# Patient Record
Sex: Female | Born: 1990 | Race: Black or African American | Hispanic: No | Marital: Married | Smoking: Never smoker
Health system: Southern US, Community
[De-identification: ages and names within clinical notes are randomized; demographics above are authoritative.]

---

## 2015-03-16 NOTE — L&D Delivery Note (Signed)
Pt complete and at +2 station with urge to push. Epidural controlling pain. Pt pushed 3 times in to deliver a viable female infant in ROA position over intact perineum. Pt was flattened and placed in McRoberts position over next two pushes and then delivered infants shoulders after baby rotated to LOA position. Body easily followed next.  Infant placed on mothers abdomen and bulb suction of mouth and nose performed. Cord was then clamped and cut by FOB after a minute delay. Cord blood obtained. Baby had a vigorous spontaneous cry noted. Placenta then delivered about 5-7 mins later intact, 3VC shultz. Fundal massage performed and pitocin per protocol. Fundus firm. Inspection confirmed periurethral hemostatic abrasions only. No repair needed.  Mother and baby stable. Counts correct. Apgars of 8 and 9; weight pending

## 2015-03-21 LAB — OB RESULTS CONSOLE GC/CHLAMYDIA
CHLAMYDIA, DNA PROBE: NEGATIVE
CHLAMYDIA, DNA PROBE: NEGATIVE
GC PROBE AMP, GENITAL: NEGATIVE
GC PROBE AMP, GENITAL: NEGATIVE

## 2015-03-21 LAB — OB RESULTS CONSOLE ABO/RH: RH TYPE: POSITIVE

## 2015-03-21 LAB — OB RESULTS CONSOLE HEPATITIS B SURFACE ANTIGEN: HEP B S AG: NEGATIVE

## 2015-03-21 LAB — OB RESULTS CONSOLE RUBELLA ANTIBODY, IGM: Rubella: IMMUNE

## 2015-03-21 LAB — OB RESULTS CONSOLE HIV ANTIBODY (ROUTINE TESTING): HIV: NONREACTIVE

## 2015-03-21 LAB — OB RESULTS CONSOLE RPR: RPR: NONREACTIVE

## 2015-05-19 ENCOUNTER — Other Ambulatory Visit (HOSPITAL_COMMUNITY): Payer: Self-pay | Admitting: Obstetrics and Gynecology

## 2015-05-21 ENCOUNTER — Other Ambulatory Visit (HOSPITAL_COMMUNITY): Payer: Self-pay | Admitting: Obstetrics and Gynecology

## 2015-05-21 ENCOUNTER — Ambulatory Visit (HOSPITAL_COMMUNITY)
Admission: RE | Admit: 2015-05-21 | Discharge: 2015-05-21 | Disposition: A | Discharge status: Home / Self Care | Payer: 59 | Source: Ambulatory Visit | Attending: Obstetrics and Gynecology | Admitting: Obstetrics and Gynecology

## 2015-05-21 DIAGNOSIS — Z3689 Encounter for other specified antenatal screening: Secondary | ICD-10-CM

## 2015-05-21 DIAGNOSIS — O283 Abnormal ultrasonic finding on antenatal screening of mother: Secondary | ICD-10-CM | POA: Insufficient documentation

## 2015-05-21 DIAGNOSIS — Z3A20 20 weeks gestation of pregnancy: Secondary | ICD-10-CM | POA: Insufficient documentation

## 2015-05-21 DIAGNOSIS — O99212 Obesity complicating pregnancy, second trimester: Secondary | ICD-10-CM | POA: Insufficient documentation

## 2015-07-11 LAB — OB RESULTS CONSOLE HIV ANTIBODY (ROUTINE TESTING): HIV: NONREACTIVE

## 2015-07-29 ENCOUNTER — Encounter (HOSPITAL_COMMUNITY): Payer: Self-pay

## 2015-07-30 ENCOUNTER — Other Ambulatory Visit (HOSPITAL_COMMUNITY): Payer: Self-pay | Admitting: Obstetrics and Gynecology

## 2015-07-30 ENCOUNTER — Encounter (HOSPITAL_COMMUNITY): Payer: Self-pay

## 2015-07-30 ENCOUNTER — Ambulatory Visit (HOSPITAL_COMMUNITY)
Admission: RE | Admit: 2015-07-30 | Discharge: 2015-07-30 | Disposition: A | Discharge status: Home / Self Care | Payer: 59 | Source: Ambulatory Visit | Attending: Obstetrics and Gynecology | Admitting: Obstetrics and Gynecology

## 2015-07-30 DIAGNOSIS — Z3A30 30 weeks gestation of pregnancy: Secondary | ICD-10-CM | POA: Diagnosis not present

## 2015-07-30 DIAGNOSIS — O283 Abnormal ultrasonic finding on antenatal screening of mother: Secondary | ICD-10-CM | POA: Insufficient documentation

## 2015-07-30 DIAGNOSIS — O99213 Obesity complicating pregnancy, third trimester: Secondary | ICD-10-CM | POA: Diagnosis not present

## 2015-07-30 DIAGNOSIS — O358XX0 Maternal care for other (suspected) fetal abnormality and damage, not applicable or unspecified: Secondary | ICD-10-CM

## 2015-07-30 DIAGNOSIS — O35EXX0 Maternal care for other (suspected) fetal abnormality and damage, fetal genitourinary anomalies, not applicable or unspecified: Secondary | ICD-10-CM

## 2015-09-01 LAB — OB RESULTS CONSOLE GBS: STREP GROUP B AG: NEGATIVE

## 2015-09-30 ENCOUNTER — Inpatient Hospital Stay (HOSPITAL_COMMUNITY): Payer: 59 | Admitting: Anesthesiology

## 2015-09-30 ENCOUNTER — Inpatient Hospital Stay (HOSPITAL_COMMUNITY)
Admission: AD | Admit: 2015-09-30 | Discharge: 2015-10-02 | DRG: 775 | Disposition: A | Discharge status: Home / Self Care | Payer: 59 | Source: Ambulatory Visit | Attending: Obstetrics and Gynecology | Admitting: Obstetrics and Gynecology

## 2015-09-30 ENCOUNTER — Encounter (HOSPITAL_COMMUNITY): Payer: Self-pay

## 2015-09-30 DIAGNOSIS — Z3A39 39 weeks gestation of pregnancy: Secondary | ICD-10-CM | POA: Diagnosis not present

## 2015-09-30 DIAGNOSIS — Z3483 Encounter for supervision of other normal pregnancy, third trimester: Secondary | ICD-10-CM | POA: Diagnosis present

## 2015-09-30 DIAGNOSIS — Z3490 Encounter for supervision of normal pregnancy, unspecified, unspecified trimester: Secondary | ICD-10-CM

## 2015-09-30 LAB — CBC
HCT: 32.6 % — ABNORMAL LOW (ref 36.0–46.0)
HEMOGLOBIN: 10.9 g/dL — AB (ref 12.0–15.0)
MCH: 29.4 pg (ref 26.0–34.0)
MCHC: 33.4 g/dL (ref 30.0–36.0)
MCV: 87.9 fL (ref 78.0–100.0)
Platelets: 282 10*3/uL (ref 150–400)
RBC: 3.71 MIL/uL — AB (ref 3.87–5.11)
RDW: 13.2 % (ref 11.5–15.5)
WBC: 6.6 10*3/uL (ref 4.0–10.5)

## 2015-09-30 LAB — ABO/RH: ABO/RH(D): O POS

## 2015-09-30 LAB — TYPE AND SCREEN
ABO/RH(D): O POS
Antibody Screen: NEGATIVE

## 2015-09-30 MED ORDER — ACETAMINOPHEN 325 MG PO TABS: 650.0000 mg | ORAL_TABLET | ORAL | Status: DC | PRN

## 2015-09-30 MED ORDER — LACTATED RINGERS IV SOLN: 500.0000 mL | INTRAVENOUS | Status: DC | PRN

## 2015-09-30 MED ORDER — LACTATED RINGERS IV SOLN
INTRAVENOUS | Status: DC
  Administered 2015-09-30: 17:00:00 via INTRAVENOUS

## 2015-09-30 MED ORDER — OXYTOCIN BOLUS FROM INFUSION
500.0000 mL | INTRAVENOUS | Status: DC
  Administered 2015-10-01: 500 mL via INTRAVENOUS

## 2015-09-30 MED ORDER — FENTANYL 2.5 MCG/ML BUPIVACAINE 1/10 % EPIDURAL INFUSION (WH - ANES)
14.0000 mL/h | INTRAMUSCULAR | Status: DC | PRN
  Administered 2015-09-30: 14 mL/h via EPIDURAL
  Filled 2015-09-30: qty 125

## 2015-09-30 MED ORDER — OXYCODONE-ACETAMINOPHEN 5-325 MG PO TABS: 1.0000 | ORAL_TABLET | ORAL | Status: DC | PRN

## 2015-09-30 MED ORDER — PHENYLEPHRINE 40 MCG/ML (10ML) SYRINGE FOR IV PUSH (FOR BLOOD PRESSURE SUPPORT)
80.0000 ug | PREFILLED_SYRINGE | INTRAVENOUS | Status: DC | PRN
  Filled 2015-09-30: qty 10
  Filled 2015-09-30: qty 5

## 2015-09-30 MED ORDER — SOD CITRATE-CITRIC ACID 500-334 MG/5ML PO SOLN: 30.0000 mL | ORAL | Status: DC | PRN

## 2015-09-30 MED ORDER — OXYTOCIN 40 UNITS IN LACTATED RINGERS INFUSION - SIMPLE MED
2.5000 [IU]/h | INTRAVENOUS | Status: DC
  Filled 2015-09-30: qty 1000

## 2015-09-30 MED ORDER — OXYCODONE-ACETAMINOPHEN 5-325 MG PO TABS: 2.0000 | ORAL_TABLET | ORAL | Status: DC | PRN

## 2015-09-30 MED ORDER — LIDOCAINE HCL (PF) 1 % IJ SOLN
30.0000 mL | INTRAMUSCULAR | Status: DC | PRN
  Filled 2015-09-30: qty 30

## 2015-09-30 MED ORDER — OXYTOCIN 40 UNITS IN LACTATED RINGERS INFUSION - SIMPLE MED
1.0000 m[IU]/min | INTRAVENOUS | Status: DC
  Administered 2015-09-30: 2 m[IU]/min via INTRAVENOUS

## 2015-09-30 MED ORDER — EPHEDRINE 5 MG/ML INJ
10.0000 mg | INTRAVENOUS | Status: DC | PRN
  Filled 2015-09-30: qty 2

## 2015-09-30 MED ORDER — DIPHENHYDRAMINE HCL 50 MG/ML IJ SOLN: 12.5000 mg | INTRAMUSCULAR | Status: DC | PRN

## 2015-09-30 MED ORDER — TERBUTALINE SULFATE 1 MG/ML IJ SOLN
0.2500 mg | Freq: Once | INTRAMUSCULAR | Status: DC | PRN
  Filled 2015-09-30: qty 1

## 2015-09-30 MED ORDER — ONDANSETRON HCL 4 MG/2ML IJ SOLN: 4.0000 mg | Freq: Four times a day (QID) | INTRAMUSCULAR | Status: DC | PRN

## 2015-09-30 MED ORDER — LACTATED RINGERS IV SOLN: 500.0000 mL | Freq: Once | INTRAVENOUS | Status: DC

## 2015-09-30 MED ORDER — LIDOCAINE HCL (PF) 1 % IJ SOLN
INTRAMUSCULAR | Status: DC | PRN
  Administered 2015-09-30 (×2): 5 mL

## 2015-09-30 NOTE — Anesthesia Preprocedure Evaluation (Signed)

## 2015-09-30 NOTE — H&P (Signed)
Mackenzie Rosario is a 25 y.o. female presenting for elective iol at term. Pt has had irregular contractions in past few days; no LOF or VB. +Fms Pt's prenatal course is dated per first trimester [place]. She screened negative for [place] as conceived [place]. Pregnancy was complicated by noted b/l pyelectasis noted on fetal scan - followed by mfm with resolution of left side and persistence of right but no progression - plan to have baby assessed after delivery. She is GBS negative  Maternal Medical History:  Reason for admission: Contractions.  Nausea.  Contractions: Onset was yesterday.   Frequency: irregular.   Perceived severity is mild.    Fetal activity: Perceived fetal activity is normal.   Last perceived fetal movement was within the past hour.    Prenatal complications: no prenatal complications Prenatal Complications - Diabetes: none.    OB History    Gravida Para Term Preterm AB TAB SAB Ectopic Multiple Living   5         2     History reviewed. No pertinent past medical history. History reviewed. No pertinent past surgical history. Family History: family history is not on file. Social History:  reports that she has never smoked. She has never used smokeless tobacco. She reports that she does not drink alcohol or use illicit drugs.   Prenatal Transfer Tool  Maternal Diabetes: No Genetic Screening: Normal Maternal Ultrasounds/Referrals: Abnormal:  Findings:   Fetal renal pyelectasis Fetal Ultrasounds or other Referrals:  Referred to Materal Fetal Medicine  Maternal Substance Abuse:  No Significant Maternal Medications:  None Significant Maternal Lab Results:  Lab values include: Group B Strep negative Other Comments:  None  Review of Systems  Constitutional: Negative for fever, chills, weight loss and malaise/fatigue.  Eyes: Negative for blurred vision and double vision.  Respiratory: Negative for shortness of breath.   Cardiovascular: Negative for chest  pain.  Gastrointestinal: Positive for abdominal pain. Negative for heartburn, nausea and vomiting.  Genitourinary: Negative for dysuria.  Musculoskeletal: Positive for myalgias and back pain.  Skin: Negative for itching and rash.  Neurological: Negative for dizziness and headaches.  Psychiatric/Behavioral: Negative for depression, suicidal ideas, hallucinations and substance abuse. The patient has insomnia. The patient is not nervous/anxious.       Blood pressure 118/72, pulse 91, temperature 98.4 F (36.9 C), temperature source Oral, resp. rate 16, last menstrual period 12/31/2014. Maternal Exam:  Uterine Assessment: Contraction strength is mild.  Contraction frequency is rare.   Abdomen: Patient reports generalized tenderness.  Estimated fetal weight is AGA.   Fetal presentation: vertex  Introitus: Normal vulva. Normal vagina.  Pelvis: adequate for delivery.   Cervix: Cervix evaluated by digital exam.     Physical Exam  Constitutional: She is oriented to person, place, and time. She appears well-developed and well-nourished.  HENT:  Head: Normocephalic.  Eyes: Pupils are equal, round, and reactive to light.  Neck: Normal range of motion.  Cardiovascular: Normal rate.   Respiratory: Effort normal.  GI: Soft. There is generalized tenderness.  Genitourinary: Vagina normal and uterus normal.  Musculoskeletal: Normal range of motion. She exhibits tenderness. She exhibits no edema.  Neurological: She is alert and oriented to person, place, and time.  Skin: Skin is warm.  Psychiatric: She has a normal mood and affect. Her behavior is normal. Judgment and thought content normal.    Prenatal labs: ABO, Rh:   Antibody:   Rubella: Immune (01/06 0000) RPR:    HBsAg: Negative (01/06 0000)  HIV: Non-reactive (04/28 0000)  GBS: Negative (06/19 0000)   Assessment/Plan: [id number] at 3639 6/[redacted]wks gestation for elective iol Will start pitocin  Will AROM when able Epidural prn Anticipate  svd   [facility] LL[facility]ecilia Worema Rand Rosario 09/30/2015, 5:07 PM

## 2015-09-30 NOTE — Anesthesia Procedure Notes (Signed)
Epidural Patient location during procedure: OB  Staffing Anesthesiologist: Elvin Mccartin Performed by: anesthesiologist   Preanesthetic Checklist Completed: patient identified, site marked, surgical consent, pre-op evaluation, timeout performed, IV checked, risks and benefits discussed and monitors and equipment checked  Epidural Patient position: sitting Prep: DuraPrep Patient monitoring: heart rate, continuous pulse ox and blood pressure Approach: right paramedian Location: L3-L4 Injection technique: LOR saline  Needle:  Needle type: Tuohy  Needle gauge: 17 G Needle length: 9 cm and 9 Needle insertion depth: 6 cm Catheter type: closed end flexible Catheter size: 20 Guage Catheter at skin depth: 11 cm Test dose: negative  Assessment Events: blood not aspirated, injection not painful, no injection resistance, negative IV test and no paresthesia  Additional Notes Patient identified. Risks/Benefits/Options discussed with patient including but not limited to bleeding, infection, nerve damage, paralysis, failed block, incomplete pain control, headache, blood pressure changes, nausea, vomiting, reactions to medication both or allergic, itching and postpartum back pain. Confirmed with bedside nurse the patient's most recent platelet count. Confirmed with patient that they are not currently taking any anticoagulation, have any bleeding history or any family history of bleeding disorders. Patient expressed understanding and wished to proceed. All questions were answered. Sterile technique was used throughout the entire procedure. Please see nursing notes for vital signs. Test dose was given through epidural needle and negative prior to continuing to dose epidural or start infusion. Warning signs of high block given to the patient including shortness of breath, tingling/numbness in hands, complete motor block, or any concerning symptoms with instructions to call for help. Patient was given  instructions on fall risk and not to get out of bed. All questions and concerns addressed with instructions to call with any issues.   

## 2015-09-30 NOTE — Progress Notes (Signed)
Patient ID: Mackenzie Rosario, female   DOB: 1991/01/12, 25 y.o.   MRN: [id number] Pt comfortable with epidural. No complaints VSS EFM - cat 1 TOCO - q 3-434mins SVE 4-5cm dil  A/P: Progressing well on pitocin         Anticipate svd

## 2015-09-30 NOTE — Progress Notes (Signed)
Patient ID: Mackenzie ScarletDeandraya Rosario, female   DOB: 20-Oct-1990, 25 y.o.   MRN: [id number] Pt comfortable - appreciating contractions but tolerable. No complaints. +Fms VSS EFM 140, moderate variability, +accels, -decels, cat 1 TOCO contractions q 1-3 SVE 4/80/-2  A/P: Progressing in labor on pitocin          AROM performed with moderate return of clear amniotic fluid         Anticipate svd         Pain control prn

## 2015-09-30 NOTE — Anesthesia Pain Management Evaluation Note (Signed)
  CRNA Pain Management Visit Note  Patient: Mackenzie Rosario, 25 y.o., female  "Hello I am a [facility]. We have an anesthesia team available at all times to provide care throughout the hospital, including epidural management and anesthesia for C-section. I don't know your plan for the delivery whether it a natural birth, water birth, IV sedation, nitrous supplementation, doula or epidural, but we want to meet your pain goals."   1.Was your pain managed to your expectations on prior hospitalizations?   Yes   2.What is your expectation for pain management during this hospitalization?     Epidural  3.How can we help you reach that goal? epidural  Record the patient's initial score and the patient's pain goal.   Pain: 1  Pain Goal: 5 [facility] wants you to be able to say your pain was always managed very well.  Mackenzie HickmanGREGORY,Amita Atayde 09/30/2015

## 2015-10-01 ENCOUNTER — Encounter (HOSPITAL_COMMUNITY): Payer: Self-pay

## 2015-10-01 LAB — CBC
HEMATOCRIT: 31.8 % — AB (ref 36.0–46.0)
HEMOGLOBIN: 10.7 g/dL — AB (ref 12.0–15.0)
MCH: 29.6 pg (ref 26.0–34.0)
MCHC: 33.6 g/dL (ref 30.0–36.0)
MCV: 88.1 fL (ref 78.0–100.0)
Platelets: 246 10*3/uL (ref 150–400)
RBC: 3.61 MIL/uL — AB (ref 3.87–5.11)
RDW: 13.1 % (ref 11.5–15.5)
WBC: 10.5 10*3/uL (ref 4.0–10.5)

## 2015-10-01 MED ORDER — PRENATAL MULTIVITAMIN CH
1.0000 | ORAL_TABLET | Freq: Every day | ORAL | Status: DC
  Administered 2015-10-01: 1 via ORAL
  Filled 2015-10-01 (×2): qty 1

## 2015-10-01 MED ORDER — DIBUCAINE 1 % RE OINT
1.0000 | TOPICAL_OINTMENT | RECTAL | Status: DC | PRN
Start: 2015-10-01 — End: 2015-10-02

## 2015-10-01 MED ORDER — SIMETHICONE 80 MG PO CHEW: 80.0000 mg | CHEWABLE_TABLET | ORAL | Status: DC | PRN

## 2015-10-01 MED ORDER — IBUPROFEN 600 MG PO TABS
600.0000 mg | ORAL_TABLET | Freq: Four times a day (QID) | ORAL | Status: DC
  Administered 2015-10-01 – 2015-10-02 (×5): 600 mg via ORAL
  Filled 2015-10-01 (×6): qty 1

## 2015-10-01 MED ORDER — ONDANSETRON HCL 4 MG/2ML IJ SOLN: 4.0000 mg | INTRAMUSCULAR | Status: DC | PRN

## 2015-10-01 MED ORDER — DIPHENHYDRAMINE HCL 25 MG PO CAPS: 25.0000 mg | ORAL_CAPSULE | Freq: Four times a day (QID) | ORAL | Status: DC | PRN

## 2015-10-01 MED ORDER — COCONUT OIL OIL: 1.0000 "application " | TOPICAL_OIL | Status: DC | PRN

## 2015-10-01 MED ORDER — BENZOCAINE-MENTHOL 20-0.5 % EX AERO: 1.0000 "application " | INHALATION_SPRAY | CUTANEOUS | Status: DC | PRN

## 2015-10-01 MED ORDER — WITCH HAZEL-GLYCERIN EX PADS: 1.0000 "application " | MEDICATED_PAD | CUTANEOUS | Status: DC | PRN

## 2015-10-01 MED ORDER — ONDANSETRON HCL 4 MG PO TABS: 4.0000 mg | ORAL_TABLET | ORAL | Status: DC | PRN

## 2015-10-01 MED ORDER — ZOLPIDEM TARTRATE 5 MG PO TABS: 5.0000 mg | ORAL_TABLET | Freq: Every evening | ORAL | Status: DC | PRN

## 2015-10-01 MED ORDER — TETANUS-DIPHTH-ACELL PERTUSSIS 5-2.5-18.5 LF-MCG/0.5 IM SUSP: 0.5000 mL | Freq: Once | INTRAMUSCULAR | Status: DC

## 2015-10-01 MED ORDER — SENNOSIDES-DOCUSATE SODIUM 8.6-50 MG PO TABS
2.0000 | ORAL_TABLET | ORAL | Status: DC
  Administered 2015-10-02: 2 via ORAL
  Filled 2015-10-01: qty 2

## 2015-10-01 MED ORDER — ACETAMINOPHEN 325 MG PO TABS: 650.0000 mg | ORAL_TABLET | ORAL | Status: DC | PRN

## 2015-10-01 NOTE — Lactation Note (Signed)
This note was copied from a baby's chart. Lactation Consultation Note  Patient Name: Mackenzie Rosario Mackenzie Rosario's Date: 10/01/2015 Reason for consult: Follow-up assessment Mom had baby latched in cradle hold when LC arrived but baby did not appear to have good depth with latch. Assisted Mom with positioning to obtain more depth with latch. Demonstrated how to un-tuck lips at breast. Encouraged Mom to BF with feeding ques, advised baby should be at breast 8-12 time or more in 24 hours, cluster feeding discussed. Encouraged STS and to wake baby if needed. Mom denies discomfort with nursing, denies other questions/concerns. Encouraged to call for assist as needed.   Maternal Data    Feeding Feeding Type: Breast Fed Length of feed: 20 min  LATCH Score/Interventions Latch: Grasps breast easily, tongue down, lips flanged, rhythmical sucking. Intervention(s): Adjust position;Assist with latch;Breast massage;Breast compression  Audible Swallowing: A few with stimulation  Type of Nipple: Everted at rest and after stimulation  Comfort (Breast/Nipple): Soft / non-tender     Hold (Positioning): Assistance needed to correctly position infant at breast and maintain latch. Intervention(s): Breastfeeding basics reviewed;Support Pillows;Position options;Skin to skin  LATCH Score: 8  Lactation Tools Discussed/Used     Consult Status Consult Status: Follow-up Date: 10/02/15 Follow-up type: In-patient    Alfred LevinsGranger, Maicol Bowland Ann 10/01/2015, 4:02 PM

## 2015-10-01 NOTE — Progress Notes (Signed)
Post Partum Day 1 Subjective: no complaints and tolerating PO  Objective: Blood pressure 110/56, pulse 76, temperature 98.9 F (37.2 C), temperature source Oral, resp. rate 18, last menstrual period 12/31/2014, SpO2 98 %, unknown if currently breastfeeding.  Physical Exam:  General: alert and cooperative Lochia: appropriate Uterine Fundus: firm    Recent Labs  09/30/15 1630 10/01/15 0602  HGB 10.9* 10.7*  HCT 32.6* 31.8*    Assessment/Plan: Plan for discharge tomorrow   LOS: 1 day   Gwenetta Devos W 10/01/2015, 10:09 AM

## 2015-10-01 NOTE — Progress Notes (Signed)
Admission nutrition screen triggered for unintentional weight loss > 10 lbs within the last month. PNR indicates a 1 lb weight loss  . Patients chart reviewed and assessed  for nutritional risk. Patient is determined to be at low nutrition  risk.   Elisabeth CaraKatherine Donyea Beverlin M.Odis LusterEd. R.D. LDN Neonatal Nutrition Support Specialist/RD III Pager [phone]      Phone [phone]

## 2015-10-01 NOTE — Anesthesia Postprocedure Evaluation (Signed)
Anesthesia Post Note  Patient: Mackenzie Rosario  Procedure(s) Performed: * No procedures listed *  Patient location during evaluation: Mother Baby Anesthesia Type: Epidural Level of consciousness: awake, awake and alert and oriented Pain management: pain level controlled Vital Signs Assessment: post-procedure vital signs reviewed and stable Respiratory status: spontaneous breathing, nonlabored ventilation and respiratory function stable Cardiovascular status: stable Postop Assessment: no headache, no backache, patient able to bend at knees, no signs of nausea or vomiting and adequate PO intake Anesthetic complications: no     Last Vitals:  Filed Vitals:   10/01/15 0310 10/01/15 0415  BP: 117/51 110/56  Pulse: 86 76  Temp: 37.3 C 37.2 C  Resp: 18 18    Last Pain:  Filed Vitals:   10/01/15 0606  PainSc: 0-No pain   Pain Goal:                 Justine Cossin

## 2015-10-01 NOTE — Lactation Note (Signed)
This note was copied from a baby's chart. Lactation Consultation Note Experienced BF mom has 396 yr old that she didn't really BF. Tried but not long. Her 27 yr old BF her for 3 months. Mom states baby BF approx. 40 min. In L&D. Mom states she has seen some colostrum. Encouraged STS, I&O. Mom encouraged to feed baby 8-12 times/24 hours and with feeding cues. Referred to Baby and Me Book in Breastfeeding section Pg. 22-23 for position options and Proper latch demonstration.WH/LC brochure given w/resources, support groups and LC services. Patient Name: Mackenzie Rosario ZOXWR'UToday's Date: 10/01/2015 Reason for consult: Initial assessment   Maternal Data Has patient been taught Hand Expression?: Yes Does the patient have breastfeeding experience prior to this delivery?: Yes  Feeding Feeding Type: Breast Fed Length of feed: 15 min  LATCH Score/Interventions Latch: Repeated attempts needed to sustain latch, nipple held in mouth throughout feeding, stimulation needed to elicit sucking reflex. Intervention(s): Adjust position;Assist with latch  Audible Swallowing: A few with stimulation Intervention(s): Skin to skin  Type of Nipple: Everted at rest and after stimulation  Comfort (Breast/Nipple): Soft / non-tender     Hold (Positioning): Assistance needed to correctly position infant at breast and maintain latch. Intervention(s): Skin to skin;Position options;Support Pillows;Breastfeeding basics reviewed  LATCH Score: 7  Lactation Tools Discussed/Used WIC Program: No   Consult Status Consult Status: Follow-up Date: 10/02/15 Follow-up type: In-patient    Charyl DancerCARVER, Arley Salamone G 10/01/2015, 3:41 AM

## 2015-10-01 NOTE — Progress Notes (Signed)
Patient ID: Mackenzie Rosario, female   DOB: May 06, 1990, 25 y.o.   MRN: [id number] Pt reports increased pelvic pressure Completely dilated and at +2station  Will commence pushing Anticipate svd

## 2015-10-02 LAB — RPR: RPR: NONREACTIVE

## 2015-10-02 MED ORDER — PRENATAL MULTIVITAMIN CH: 1.0000 | ORAL_TABLET | Freq: Every day | ORAL | Status: AC

## 2015-10-02 MED ORDER — IBUPROFEN 800 MG PO TABS: 800.0000 mg | ORAL_TABLET | Freq: Three times a day (TID) | ORAL | Status: DC | PRN

## 2015-10-02 NOTE — Lactation Note (Addendum)
This note was copied from a baby's chart. Lactation Consultation Note  Patient Name: Mackenzie Rosario's Date: 10/02/2015 Reason for consult: Follow-up assessment Charted on wrong patient.   Maternal Data    Feeding Feeding Type: Breast Fed Length of feed: 10 min  LATCH Score/Interventions                      Lactation Tools Discussed/Used     Consult Status Consult Status: Complete Date: 10/02/15 Follow-up type: In-patient    Mackenzie Rosario, Mackenzie Rosario 10/02/2015, 11:55 AM

## 2015-10-02 NOTE — Lactation Note (Signed)
This note was copied from a baby's chart. Lactation Consultation Note  Patient Name: Mackenzie Guinevere ScarletDeandraya Soledad WUJWJ'XToday's Date: 10/02/2015 Reason for consult: Follow-up assessment Mom reports baby is nursing well, denies questions/concerns. LC offered to assist with latch before d/c but Mom declined need. Encouraged to BF with feeding ques, 8-12 times in 24 hours. Engorgement care reviewed if needed, refer to Baby N Me booklet, page 24. Advised of OP services and support group. Encouraged to call for questions/concerns.   Maternal Data    Feeding Feeding Type: Breast Fed Length of feed: 10 min  LATCH Score/Interventions                      Lactation Tools Discussed/Used     Consult Status Consult Status: Complete Date: 10/02/15 Follow-up type: In-patient    Mackenzie Rosario, Mackenzie Rosario 10/02/2015, 12:13 PM

## 2015-10-02 NOTE — Progress Notes (Signed)
Post Partum Day 2 Subjective: no complaints, up ad lib, voiding, tolerating PO and nl lochia, pain controlled  Objective: Blood pressure 113/61, pulse 62, temperature 98.3 F (36.8 C), temperature source Oral, resp. rate 18, last menstrual period 12/31/2014, SpO2 98 %, unknown if currently breastfeeding.  Physical Exam:  General: alert and no distress Lochia: appropriate Uterine Fundus: firm   Recent Labs  09/30/15 1630 10/01/15 0602  HGB 10.9* 10.7*  HCT 32.6* 31.8*    Assessment/Plan: Discharge home.  Lactation c/s before d/c.  [facility]     LOS: 2 days   Bovard-Stuckert, Juwon Scripter 10/02/2015, 8:10 AM

## 2015-10-02 NOTE — Discharge Summary (Signed)
OB Discharge Summary     Patient Name: Mackenzie Rosario DOB: 1990-08-08 MRN: [id number]  Date of admission: 09/30/2015 Delivering MD: Pryor OchoaBANGA, CECILIA [facility]   Date of discharge: 10/02/2015  Admitting diagnosis: induction, direct admit, 39.6w  Intrauterine pregnancy: 2989w0d     Secondary diagnosis:  Active Problems:   Term pregnancy   SVD (spontaneous vaginal delivery)   Postpartum care following vaginal delivery  Additional problems: N/A     Discharge diagnosis: Term Pregnancy Delivered                                                                                                Post partum procedures:N/A  Augmentation: AROM and Pitocin  Complications: None  Hospital course:  Induction of Labor With Vaginal Delivery   25 y.o. yo G5P1001 at 2589w0d was admitted to the hospital 09/30/2015 for induction of labor.  Indication for induction: Favorable cervix at term.  Patient had an uncomplicated labor course as follows: Membrane Rupture Time/Date: 8:55 PM ,09/30/2015   Intrapartum Procedures: Episiotomy: None [1]                                         Lacerations:  None [1]  Patient had delivery of a Viable infant.  Information for the patient's newborn:  Jeremy Johannerry, Girl Jovani [[id number]][[id number]]  Delivery Method: Vaginal, Spontaneous Delivery (Filed from Delivery Summary)   10/01/2015  Details of delivery can be found in separate delivery note.  Patient had a routine postpartum course. Patient is discharged home 10/02/2015.   Physical exam  Filed Vitals:   10/01/15 0214 10/01/15 0310 10/01/15 0415 10/02/15 0612  BP: 122/53 117/51 110/56 113/61  Pulse: 76 86 76 62  Temp:  99.1 F (37.3 C) 98.9 F (37.2 C) 98.3 F (36.8 C)  TempSrc:  Oral Oral   Resp: 16 18 18 18   SpO2:  100% 98%    General: alert and no distress Lochia: appropriate Uterine Fundus: firm  Labs: Lab Results  Component Value Date   WBC 10.5 10/01/2015   HGB 10.7* 10/01/2015   HCT 31.8* 10/01/2015   MCV  88.1 10/01/2015   PLT 246 10/01/2015   No flowsheet data found.  Discharge instruction: per After Visit Summary and "Baby and Me Booklet".  After visit meds:    Medication List    TAKE these medications        ibuprofen 800 MG tablet  Commonly known as:  ADVIL,MOTRIN  Take 1 tablet (800 mg total) by mouth every 8 (eight) hours as needed for moderate pain.     prenatal multivitamin Tabs tablet  Take 1 tablet by mouth daily at 12 noon.        Diet: routine diet  Activity: Advance as tolerated. Pelvic rest for 6 weeks.   Outpatient follow up:6 weeks Follow up Appt:No future appointments. Follow up Visit:No Follow-up on file.  Postpartum contraception: Progesterone only pills and Vasectomy  Newborn Data: Live born female  Birth Weight: 6 lb 6.7 oz (2910 g)  APGAR: 9, 9  Baby Feeding: Breast Disposition:home with mother   10/02/2015 Sherian Rein, MD

## 2015-10-03 ENCOUNTER — Ambulatory Visit: Payer: Self-pay

## 2015-10-03 NOTE — Lactation Note (Signed)
This note was copied from a baby's chart. Lactation Consultation Note  Patient Name: Mackenzie Rosario Mackenzie ScarletV'WToday's Date: 10/03/2015  Follow up visit made prior to discharge.  Mom states baby is breastfeeding well and no questions/concerns at present.  Reviewed discharge instructions.  Outpatient lactation services/support reviewed and encouraged.   Maternal Data    Feeding Feeding Type: Breast Fed Length of feed: 15 min  LATCH Score/Interventions                      Lactation Tools Discussed/Used     Consult Status      Huston FoleyMOULDEN, Hubert Derstine S 10/03/2015, 9:50 AM

## 2016-01-22 ENCOUNTER — Emergency Department (HOSPITAL_BASED_OUTPATIENT_CLINIC_OR_DEPARTMENT_OTHER): Payer: 59

## 2016-01-22 ENCOUNTER — Encounter (HOSPITAL_BASED_OUTPATIENT_CLINIC_OR_DEPARTMENT_OTHER): Payer: Self-pay | Admitting: *Deleted

## 2016-01-22 ENCOUNTER — Emergency Department (HOSPITAL_BASED_OUTPATIENT_CLINIC_OR_DEPARTMENT_OTHER)
Admission: EM | Admit: 2016-01-22 | Discharge: 2016-01-22 | Disposition: A | Discharge status: Home / Self Care | Payer: 59 | Attending: Emergency Medicine | Admitting: Emergency Medicine

## 2016-01-22 DIAGNOSIS — Y92410 Unspecified street and highway as the place of occurrence of the external cause: Secondary | ICD-10-CM | POA: Diagnosis not present

## 2016-01-22 DIAGNOSIS — R51 Headache: Secondary | ICD-10-CM | POA: Insufficient documentation

## 2016-01-22 DIAGNOSIS — S39012A Strain of muscle, fascia and tendon of lower back, initial encounter: Secondary | ICD-10-CM | POA: Diagnosis not present

## 2016-01-22 DIAGNOSIS — M542 Cervicalgia: Secondary | ICD-10-CM | POA: Diagnosis not present

## 2016-01-22 DIAGNOSIS — Y999 Unspecified external cause status: Secondary | ICD-10-CM | POA: Insufficient documentation

## 2016-01-22 DIAGNOSIS — Y9389 Activity, other specified: Secondary | ICD-10-CM | POA: Diagnosis not present

## 2016-01-22 DIAGNOSIS — S3992XA Unspecified injury of lower back, initial encounter: Secondary | ICD-10-CM | POA: Diagnosis present

## 2016-01-22 LAB — PREGNANCY, URINE: Preg Test, Ur: NEGATIVE

## 2016-01-22 MED ORDER — CYCLOBENZAPRINE HCL 10 MG PO TABS: 10.0000 mg | ORAL_TABLET | Freq: Two times a day (BID) | ORAL | 0 refills | Status: AC | PRN

## 2016-01-22 MED ORDER — IBUPROFEN 800 MG PO TABS
800.0000 mg | ORAL_TABLET | Freq: Once | ORAL | Status: AC
  Administered 2016-01-22: 800 mg via ORAL
  Filled 2016-01-22: qty 1

## 2016-01-22 MED ORDER — CYCLOBENZAPRINE HCL 5 MG PO TABS
5.0000 mg | ORAL_TABLET | Freq: Once | ORAL | Status: DC
  Filled 2016-01-22: qty 1

## 2016-01-22 MED ORDER — CYCLOBENZAPRINE HCL 10 MG PO TABS
10.0000 mg | ORAL_TABLET | Freq: Once | ORAL | Status: AC
  Administered 2016-01-22: 10 mg via ORAL
  Filled 2016-01-22: qty 1

## 2016-01-22 MED ORDER — IBUPROFEN 800 MG PO TABS: 800.0000 mg | ORAL_TABLET | Freq: Three times a day (TID) | ORAL | 0 refills | Status: AC

## 2016-01-22 MED FILL — CYCLOBENZAPRINE 10 MG TAB: 10 | 10 days supply | Qty: 20 | Fill #0

## 2016-01-22 MED FILL — IBUPROFEN 800 MG TABLET: 800 | 7 days supply | Qty: 21 | Fill #0
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
## 2016-01-22 NOTE — Discharge Instructions (Signed)
Take 800 mg ibuprofen three times daily for pain.  You may also take 1000 mg Tylenol every 6 hours for pain.  Take Flexeril twice daily as needed for muscle stiffness.  Ice your lower back and neck for 20 minutes 3 times daily.  Follow up with your primary care physician in 1 week as needed.  Return to the ED if you experience sudden worsening pain, numbness, weakness, or any new or concerning symptoms.

## 2016-01-22 NOTE — ED Notes (Signed)
Directed to pharmacy to pick up prescriptions. Pt has a ride at bedside. Note for work provided
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
## 2017-01-14 IMAGING — US US MFM OB FOLLOW-UP
1 series · 14 of 28 positions shown · non-contrast
Comparison: none

[Series 1: us mfm ob follow-up · 14 of 56 slices shown]
[im 3/56]
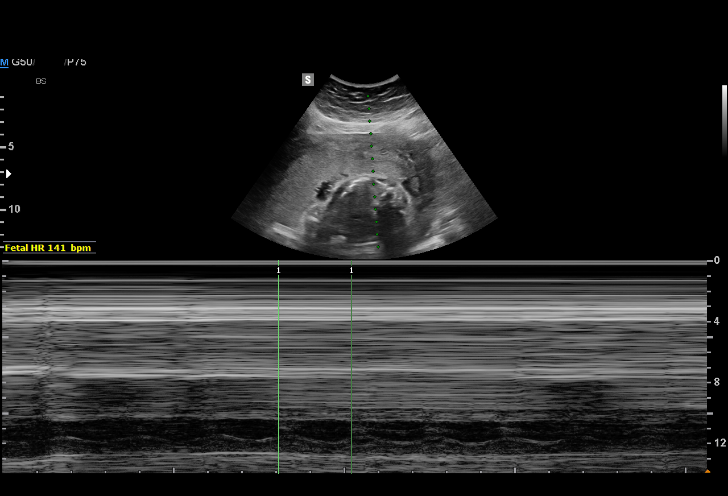
[im 7/56]
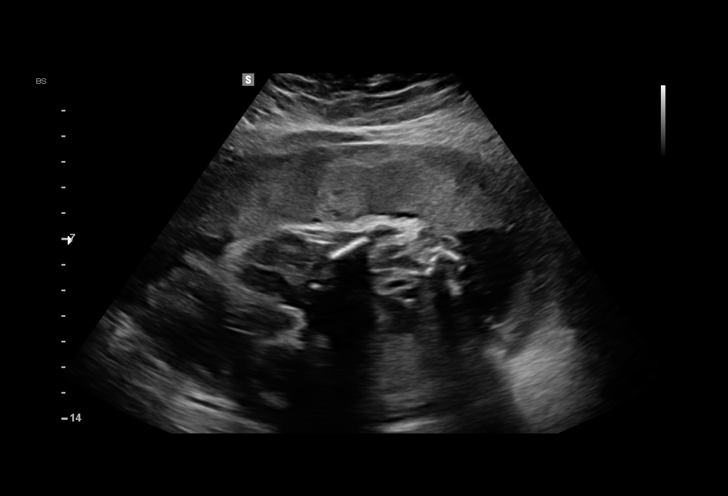
[im 11/56]
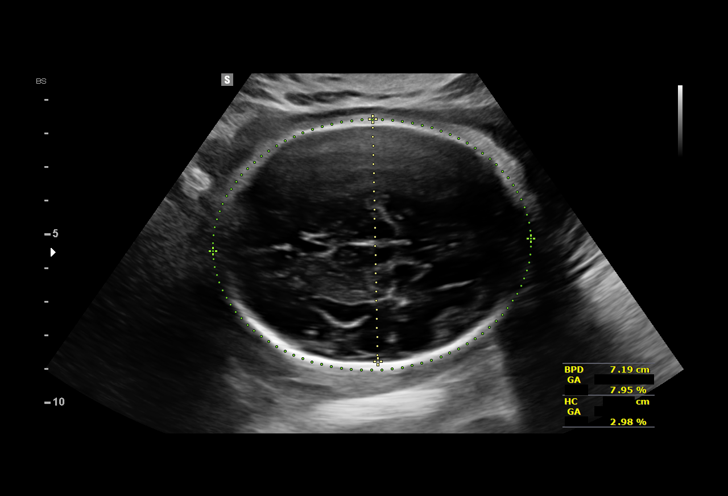
[im 15/56]
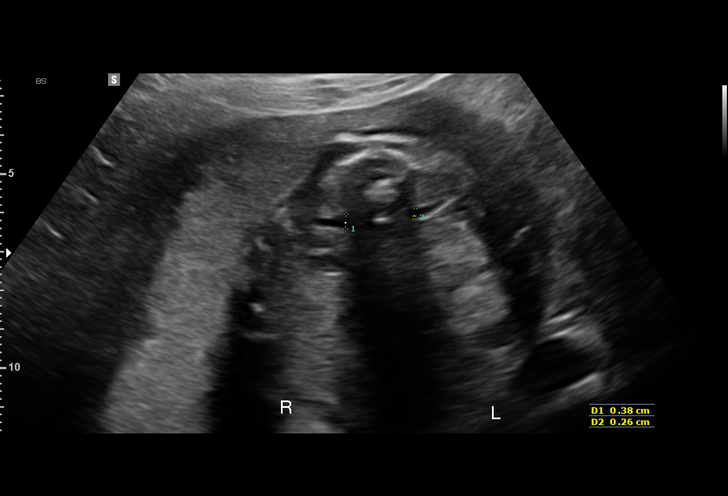
[im 19/56]
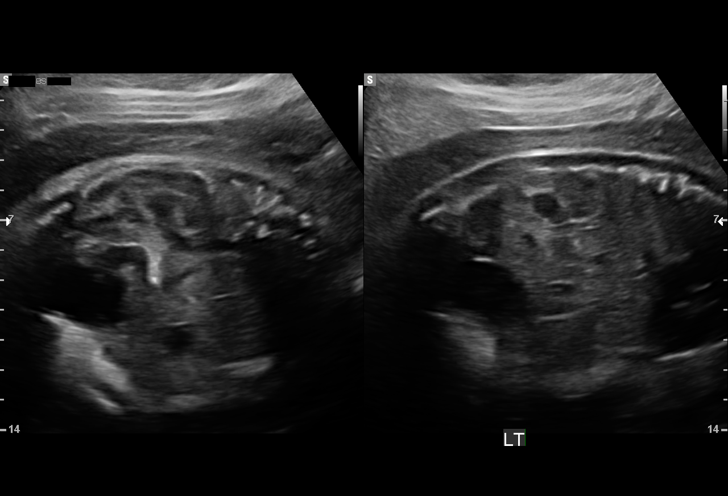
[im 23/56]
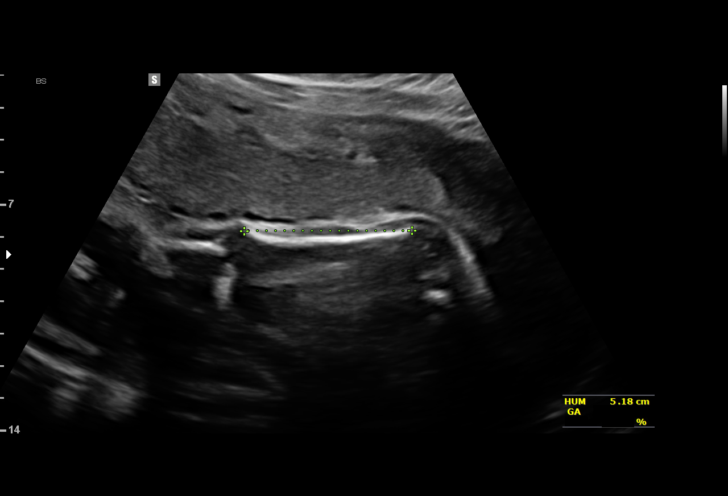
[im 27/56]
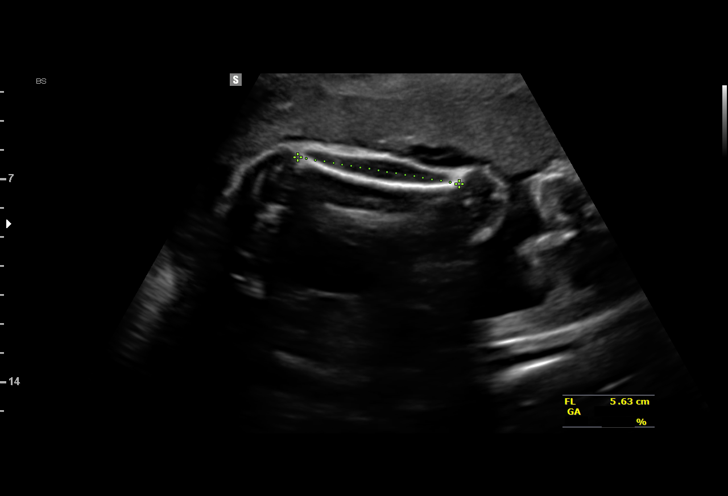
[im 31/56]
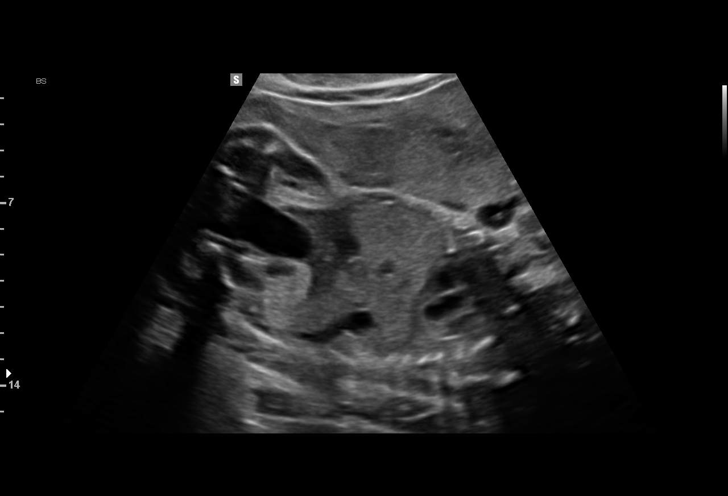
[im 35/56]
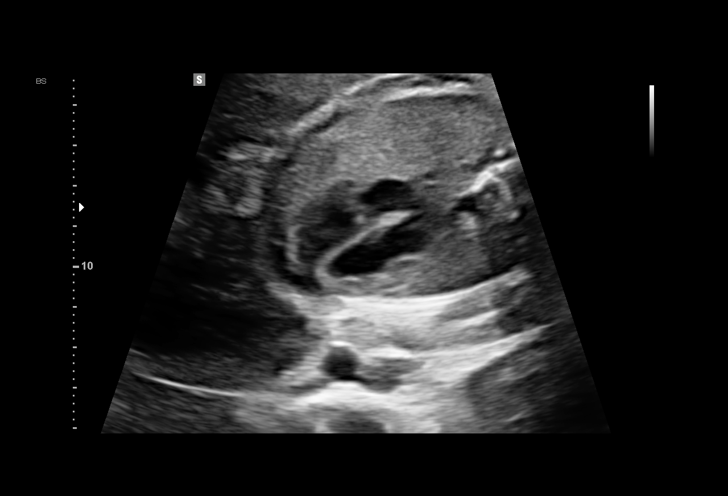
[im 39/56]
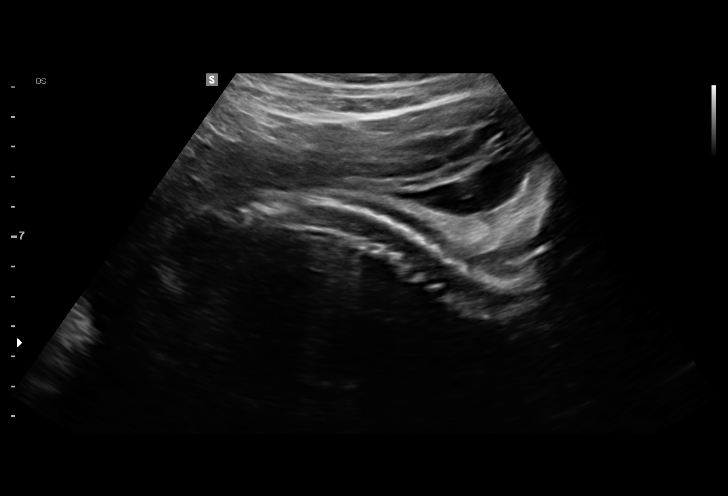
[im 43/56]
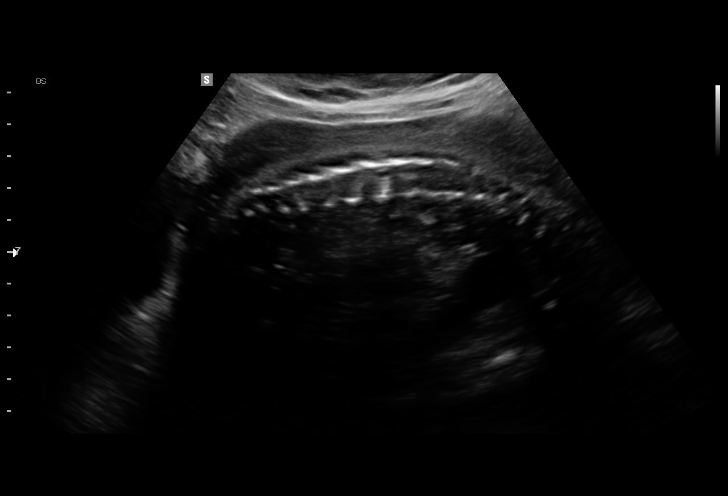
[im 47/56]
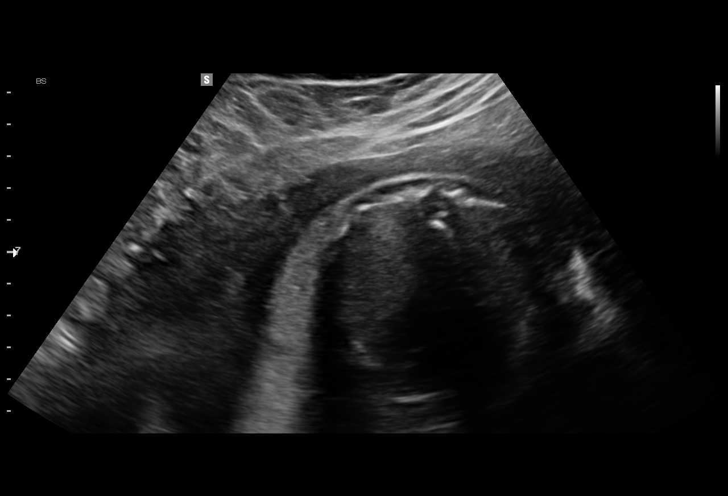
[im 51/56]
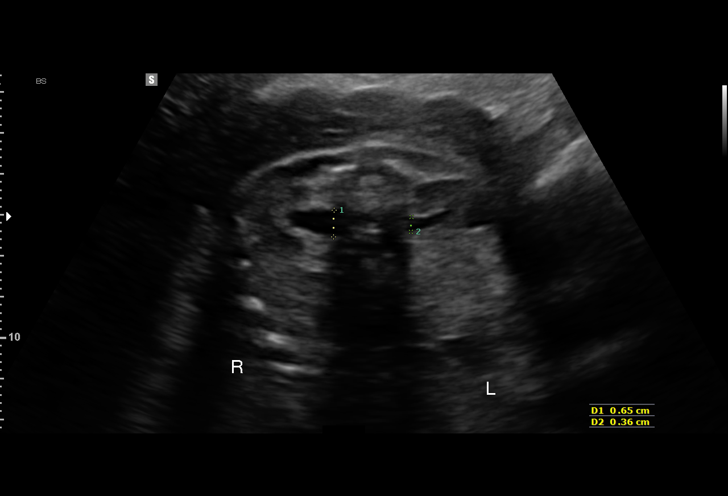
[im 56/56]
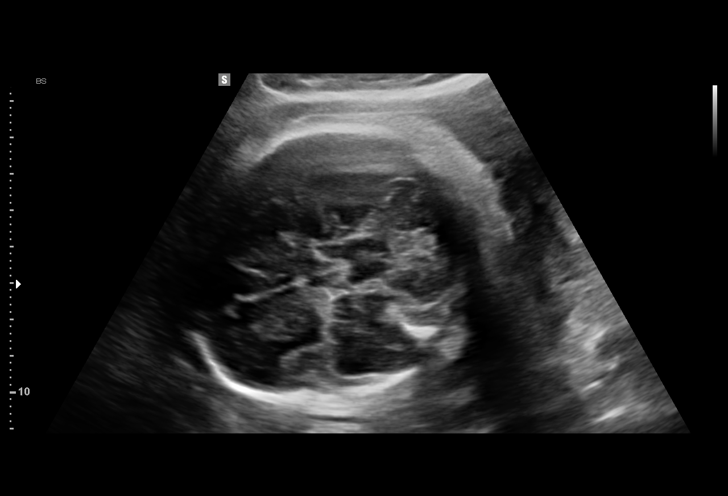

[14 of 28 positions shown; findings below may reference images not displayed]

#101

1  FIYORI HGAZE           [id number]      [id number]     [id number]
Indications

30 weeks gestation of pregnancy
Follow-up incomplete fetal anatomic            Z36
evaluation
Pyelectasis of fetus on prenatal ultrasound
Obesity complicating pregnancy, second
trimester
OB History

Blood Type:            Height:  5'4"   Weight (lb):  194      BMI:
Gravidity:    5         Term:   2        Prem:   0        SAB:   0
TOP:          2       Ectopic:  0        Living: 2
Fetal Evaluation

Num Of Fetuses:     1
Fetal Heart         141
Rate(bpm):
Cardiac Activity:   Observed
Presentation:       Cephalic
Placenta:           Anterior, above cervical os
P. Cord Insertion:  Previously seen as normal

Amniotic Fluid
AFI FV:      Subjectively within normal limits

AFI Sum(cm)     %Tile       Largest Pocket(cm)
15.13           53

RUQ(cm)       RLQ(cm)       LUQ(cm)        LLQ(cm)
4.27
Biometry

BPD:      72.2  mm     G. Age:  29w 0d         10  %    CI:        72.73   %   70 - 86
FL/HC:      20.9   %   19.2 -
HC:      269.2  mm     G. Age:  29w 2d          6  %    HC/AC:      1.05       0.99 -
AC:      256.2  mm     G. Age:  29w 6d         35  %    FL/BPD:     78.0   %   71 - 87
FL:       56.3  mm     G. Age:  29w 4d         22  %    FL/AC:      22.0   %   20 - 24
HUM:      51.9  mm     G. Age:  30w 2d         56  %

Est. FW:    1516  gm      3 lb 2 oz     43  %
Gestational Age

LMP:           30w 1d       Date:   12/31/14                 EDD:   10/07/15
U/S Today:     29w 3d                                        EDD:   10/12/15
Best:          30w 1d    Det. By:   LMP  (12/31/14)          EDD:   10/07/15
Anatomy

Cranium:               Appears normal         LVOT:                   Previously seen
Cavum:                 Previously seen        Aortic Arch:            Previously seen
Ventricles:            Appears normal         Ductal Arch:            Previously seen
Choroid Plexus:        Previously seen        Diaphragm:              Appears normal
Cerebellum:            Appears normal         Stomach:                Appears normal, left
sided
Posterior Fossa:       Previously seen        Abdomen:                Appears normal
Nuchal Fold:           Previously seen        Abdominal Wall:         Previously seen
Face:                  Orbits and profile     Cord Vessels:           Previously seen
previously seen
Lips:                  Previously seen        Kidneys:                Right sided
pyelectasis,
mm
Palate:                Not well visualized    Bladder:                Appears normal
Thoracic:              Appears normal         Spine:                  Appears normal
Heart:                 Appears normal         Upper Extremities:      Previously seen
(4CH, axis, and
situs)
RVOT:                  Previously seen        Lower Extremities:      Previously seen
Cervix Uterus Adnexa

Cervix
Not visualized (advanced GA >70wks)

Left Ovary
Previously seen.

Right Ovary
Previously seen
Impression

Single IUP at 30w 1d
Follow up due to fetal urinary tract dilation
Mild / borderline right urinary tract dilation is again noted.
The right renal pelvis measures 7-7.5 mm
No calyceal dilation.  The left kidney appears normal
Fetal growth is appropriate (43rd %tile)
Posterior placenta without previa
Normal amniotic fluid volume
Recommendations

Notify Peds at time of delivery - the newborn will need further
renal imaging studies after birth.
Otherwise, follow up ultrasounds as clinically indicated
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
